# Patient Record
Sex: Male | Born: 1985 | Hispanic: No | Marital: Married | Smoking: Never smoker
Health system: Southern US, Community
[De-identification: ages and names within clinical notes are randomized; demographics above are authoritative.]

---

## 2014-08-14 ENCOUNTER — Encounter (HOSPITAL_COMMUNITY): Payer: Self-pay | Admitting: Emergency Medicine

## 2014-08-14 ENCOUNTER — Emergency Department (INDEPENDENT_AMBULATORY_CARE_PROVIDER_SITE_OTHER)
Admission: EM | Admit: 2014-08-14 | Discharge: 2014-08-14 | Disposition: A | Discharge status: Home / Self Care | Payer: Self-pay | Source: Home / Self Care | Attending: Family Medicine | Admitting: Family Medicine

## 2014-08-14 ENCOUNTER — Emergency Department (INDEPENDENT_AMBULATORY_CARE_PROVIDER_SITE_OTHER): Payer: Self-pay

## 2014-08-14 DIAGNOSIS — S91331A Puncture wound without foreign body, right foot, initial encounter: Secondary | ICD-10-CM

## 2014-08-14 DIAGNOSIS — M795 Residual foreign body in soft tissue: Secondary | ICD-10-CM

## 2014-08-14 DIAGNOSIS — Z23 Encounter for immunization: Secondary | ICD-10-CM

## 2014-08-14 MED ORDER — CIPROFLOXACIN HCL 500 MG PO TABS: 500.0000 mg | ORAL_TABLET | Freq: Two times a day (BID) | ORAL | Status: AC

## 2014-08-14 MED ORDER — SULFAMETHOXAZOLE-TRIMETHOPRIM 800-160 MG PO TABS: 2.0000 | ORAL_TABLET | Freq: Two times a day (BID) | ORAL | Status: AC

## 2014-08-14 MED ORDER — TETANUS-DIPHTH-ACELL PERTUSSIS 5-2.5-18.5 LF-MCG/0.5 IM SUSP
INTRAMUSCULAR | Status: AC
  Filled 2014-08-14: qty 0.5

## 2014-08-14 MED ORDER — TETANUS-DIPHTH-ACELL PERTUSSIS 5-2.5-18.5 LF-MCG/0.5 IM SUSP
0.5000 mL | Freq: Once | INTRAMUSCULAR | Status: AC
  Administered 2014-08-14: 0.5 mL via INTRAMUSCULAR

## 2014-08-14 NOTE — ED Notes (Signed)
Reports stepping on a rusty nail 2 days ago.  Having pain at bottom of right foot.  Unsure of last T-dap

## 2014-08-14 NOTE — ED Provider Notes (Signed)
Randy Holden is a 28 y.o. male who presents to Urgent Care today for puncture wound to the plantar right foot.  Patient stepped on a nail through his shoe 2 days ago. He notes pain and swelling on the plantar aspect of his foot. He is unsure of his last tetanus vaccine. No fevers or chills vomiting or diarrhea. Pain is moderate and worse with activity.   History reviewed. No pertinent past medical history. History reviewed. No pertinent past surgical history. History  Substance Use Topics  . Smoking status: Never Smoker   . Smokeless tobacco: Not on file  . Alcohol Use: Yes   ROS as above Medications: No current facility-administered medications for this encounter.   Current Outpatient Prescriptions  Medication Sig Dispense Refill  . ciprofloxacin (CIPRO) 500 MG tablet Take 1 tablet (500 mg total) by mouth 2 (two) times daily. 20 tablet 0  . sulfamethoxazole-trimethoprim (SEPTRA DS) 800-160 MG per tablet Take 2 tablets by mouth 2 (two) times daily. 28 tablet 0   No Known Allergies   Exam:  BP 115/75 mmHg  Pulse 66  Temp(Src) 97.9 F (36.6 C) (Oral)  Resp 16  SpO2 100% Gen: Well NAD Right foot: Small nonbleeding pressure wound with surrounding erythema on the plantar aspect of the right foot at the mid fourth and fifth metatarsal area. No fluctuance or induration present. Motion is intact distally. Pulses and sensation are intact.   Patient was given a Tdap Vaccine prior to discharge.  No results found for this or any previous visit (from the past 24 hour(s)). Dg Foot 2 Views Right  08/14/2014   CLINICAL DATA:  Right foot pain, possible foreign body  EXAM: RIGHT FOOT - 2 VIEW  COMPARISON:  None.  FINDINGS: Two views of the right foot submitted. No acute fracture or subluxation. No radiopaque foreign body is identified.  IMPRESSION: No acute fracture or subluxation. No radiopaque foreign body is identified.   Electronically Signed   By: Natasha MeadLiviu  Pop M.D.   On: 08/14/2014 09:10     Assessment and Plan: 28 y.o. male with puncture wound to the plantar aspect of the right foot. The wound occurred through a shoe bottom. The wound appears to be slightly infected. Plan to treat with Bactrim and Cipro to cover MRSA and pseudomonas. Additionally tetanus vaccine updated.  Discussed warning signs or symptoms. Please see discharge instructions. Patient expresses understanding.     Rodolph BongEvan S Lysbeth Dicola, MD 08/14/14 [phone]

## 2014-08-14 NOTE — Discharge Instructions (Signed)
Thank you for coming in today. Take both Cipro and Bactrim twice daily.  Come back if not improving. Go to the emergency room if you get worse  Puncture Wound A puncture wound is an injury that extends through all layers of the skin and into the tissue beneath the skin (subcutaneous tissue). Puncture wounds become infected easily because germs often enter the body and go beneath the skin during the injury. Having a deep wound with a small entrance point makes it difficult for your caregiver to adequately clean the wound. This is especially true if you have stepped on a nail and it has passed through a dirty shoe or other situations where the wound is obviously contaminated. CAUSES  Many puncture wounds involve glass, nails, splinters, fish hooks, or other objects that enter the skin (foreign bodies). A puncture wound may also be caused by a human bite or animal bite. DIAGNOSIS  A puncture wound is usually diagnosed by your history and a physical exam. You may need to have an X-ray or an ultrasound to check for any foreign bodies still in the wound. TREATMENT   Your caregiver will clean the wound as thoroughly as possible. Depending on the location of the wound, a bandage (dressing) may be applied.  Your caregiver might prescribe antibiotic medicines.  You may need a follow-up visit to check on your wound. Follow all instructions as directed by your caregiver. HOME CARE INSTRUCTIONS   Change your dressing once per day, or as directed by your caregiver. If the dressing sticks, it may be removed by soaking the area in water.  If your caregiver has given you follow-up instructions, it is very important that you return for a follow-up appointment. Not following up as directed could result in a chronic or permanent injury, pain, and disability.  Only take over-the-counter or prescription medicines for pain, discomfort, or fever as directed by your caregiver.  If you are given antibiotics, take  them as directed. Finish them even if you start to feel better. You may need a tetanus shot if:  You cannot remember when you had your last tetanus shot.  You have never had a tetanus shot. If you got a tetanus shot, your arm may swell, get red, and feel warm to the touch. This is common and not a problem. If you need a tetanus shot and you choose not to have one, there is a rare chance of getting tetanus. Sickness from tetanus can be serious. You may need a rabies shot if an animal bite caused your puncture wound. SEEK MEDICAL CARE IF:   You have redness, swelling, or increasing pain in the wound.  You have red streaks going away from the wound.  You notice a bad smell coming from the wound or dressing.  You have yellowish-white fluid (pus) coming from the wound.  You are treated with an antibiotic for infection, but the infection is not getting better.  You notice something in the wound, such as rubber from your shoe, cloth, or another object.  You have a fever.  You have severe pain.  You have difficulty breathing.  You feel dizzy or faint.  You cannot stop vomiting.  You lose feeling, develop numbness, or cannot move a limb below the wound.  Your symptoms worsen. MAKE SURE YOU:  Understand these instructions.  Will watch your condition.  Will get help right away if you are not doing well or get worse. Document Released: 06/10/2005 Document Revised: 11/23/2011 Document Reviewed: 02/17/2011  [employer]® Patient Information ©2015 [employer]. This information is not intended to replace advice given to you by your health care provider. Make sure you discuss any questions you have with your health care provider. ° °

## 2014-12-05 ENCOUNTER — Emergency Department (HOSPITAL_COMMUNITY): Payer: Self-pay

## 2014-12-05 ENCOUNTER — Encounter (HOSPITAL_COMMUNITY): Payer: Self-pay | Admitting: Emergency Medicine

## 2014-12-05 ENCOUNTER — Emergency Department (HOSPITAL_COMMUNITY)
Admission: EM | Admit: 2014-12-05 | Discharge: 2014-12-05 | Disposition: A | Discharge status: Home / Self Care | Payer: Self-pay | Attending: Emergency Medicine | Admitting: Emergency Medicine

## 2014-12-05 DIAGNOSIS — R112 Nausea with vomiting, unspecified: Secondary | ICD-10-CM | POA: Insufficient documentation

## 2014-12-05 DIAGNOSIS — R5383 Other fatigue: Secondary | ICD-10-CM | POA: Insufficient documentation

## 2014-12-05 DIAGNOSIS — R61 Generalized hyperhidrosis: Secondary | ICD-10-CM | POA: Insufficient documentation

## 2014-12-05 DIAGNOSIS — J3489 Other specified disorders of nose and nasal sinuses: Secondary | ICD-10-CM | POA: Insufficient documentation

## 2014-12-05 DIAGNOSIS — J029 Acute pharyngitis, unspecified: Secondary | ICD-10-CM | POA: Insufficient documentation

## 2014-12-05 DIAGNOSIS — M791 Myalgia: Secondary | ICD-10-CM | POA: Insufficient documentation

## 2014-12-05 DIAGNOSIS — R509 Fever, unspecified: Secondary | ICD-10-CM | POA: Insufficient documentation

## 2014-12-05 DIAGNOSIS — R0981 Nasal congestion: Secondary | ICD-10-CM | POA: Insufficient documentation

## 2014-12-05 DIAGNOSIS — R6889 Other general symptoms and signs: Secondary | ICD-10-CM

## 2014-12-05 DIAGNOSIS — R059 Cough, unspecified: Secondary | ICD-10-CM

## 2014-12-05 DIAGNOSIS — R05 Cough: Secondary | ICD-10-CM | POA: Insufficient documentation

## 2014-12-05 MED ORDER — BENZONATATE 100 MG PO CAPS: 100.0000 mg | ORAL_CAPSULE | Freq: Three times a day (TID) | ORAL | Status: AC

## 2014-12-05 MED ORDER — GUAIFENESIN 100 MG/5ML PO LIQD: 100.0000 mg | ORAL | Status: AC | PRN

## 2014-12-05 NOTE — ED Notes (Signed)
Pt st's he started having cold symptoms 2 1/2 weeks ago.  [facility] 1[facility]2 [facility].  Pt was given Z-Pack but continues to have productive cough

## 2014-12-05 NOTE — ED Notes (Signed)
Declined W/C at D/C and was escorted to lobby by RN. 

## 2014-12-05 NOTE — ED Provider Notes (Signed)
CSN: [id number]     Arrival date & time 12/05/14  2102 History  This chart was scribed for Fayrene Helper, PA-C working with Arby Barrette, MD by Elveria Rising, ED Scribe. This patient was seen in room TR06C/TR06C and the patient's care was started at 10:30 PM.   Chief Complaint  Patient presents with  . Cough   The history is provided by the patient. No language interpreter was used.   HPI Comments: Randy Holden is a 29 y.o. male who presents to the Emergency Department complaining of persistent, lingering cough. Patient shares flu like symptoms including subjective fever, chills, myalgias, fatigue, congestion, and rhinorrhea onset of 2.5 weeks ago. Patient was evaluated and diagnosed with a URI 1.5 weeks ago; patient discharged with Zithromax. Patient endorses taking antibiotics as directed. Patient reports that he feels better since completing the antibiotics, but reports continued productive cough, sore throat, decreased appetite, rhinorrhea, and nose bleeding from frequency of blowing his nose. Patient reports worse cough at night with lying down. Patient reports treatment with OTC cough syrup, without relief. Patient reports recent sick contacts at work. Patient is not a chronic smoker; admitting an occasional cigar.  History reviewed. No pertinent past medical history. History reviewed. No pertinent past surgical history. No family history on file. History  Substance Use Topics  . Smoking status: Never Smoker   . Smokeless tobacco: Not on file  . Alcohol Use: Yes    Review of Systems  Constitutional: Positive for chills and diaphoresis. Negative for fever.  HENT: Positive for congestion, rhinorrhea and sore throat. Negative for sneezing.   Respiratory: Positive for cough.   Gastrointestinal: Positive for nausea and vomiting.   Allergies  Review of patient's allergies indicates no known allergies.  Home Medications   Prior to Admission medications   Medication Sig Start Date  End Date Taking? Authorizing Provider  ibuprofen (ADVIL,MOTRIN) 200 MG tablet Take 200 mg by mouth every 6 (six) hours as needed.   Yes Historical Provider, MD  ciprofloxacin (CIPRO) 500 MG tablet Take 1 tablet (500 mg total) by mouth 2 (two) times daily. Patient not taking: Reported on 12/05/2014 08/14/14   Rodolph Bong, MD  sulfamethoxazole-trimethoprim (SEPTRA DS) 800-160 MG per tablet Take 2 tablets by mouth 2 (two) times daily. Patient not taking: Reported on 12/05/2014 08/14/14   Rodolph Bong, MD   Triage Vitals: BP 141/78 mmHg  Pulse 125  Temp(Src) 98.5 F (36.9 C) (Oral)  Resp 24  SpO2 96% Physical Exam  Constitutional: He is oriented to person, place, and time. He appears well-developed and well-nourished. No distress.  HENT:  Head: Normocephalic and atraumatic.  Right Ear: Tympanic membrane normal.  Mouth/Throat: Uvula is midline.  Small amount of dried blood in nares bilaterally. No active bleeding. No tonsillar enlargement.   Eyes: EOM are normal.  Neck: Neck supple. No tracheal deviation present.  Cardiovascular: Normal rate, regular rhythm and normal heart sounds.   Pulmonary/Chest: Effort normal and breath sounds normal. No respiratory distress. He has no wheezes.  Abdominal: Soft. There is no tenderness.  Musculoskeletal: Normal range of motion.  Neurological: He is alert and oriented to person, place, and time.  Skin: Skin is warm. He is diaphoretic.  Psychiatric: He has a normal mood and affect. His behavior is normal.  Nursing note and vitals reviewed.   ED Course  Procedures (including critical care time)  COORDINATION OF CARE: 10:40 PM- pt with flu like sxs.  cxr neg for pna.  Plans to prescribe  cough syrup. Discussed treatment plan with patient at bedside and patient agreed to plan.   Labs Review Labs Reviewed - No data to display  Imaging Review Dg Chest 2 View  12/05/2014   CLINICAL DATA:  Productive cough, chills  EXAM: CHEST  2 VIEW  COMPARISON:  None.   FINDINGS: Cardiomediastinal silhouette is unremarkable. No acute infiltrate or pleural effusion. No pulmonary edema. Bony thorax is unremarkable.  IMPRESSION: No active cardiopulmonary disease.   Electronically Signed   By: Natasha MeadLiviu  Pop M.D.   On: 12/05/2014 22:26     EKG Interpretation None      MDM   Final diagnoses:  Cough  Flu-like symptoms    BP 126/89 mmHg  Pulse 100  Temp(Src) 98.4 F (36.9 C) (Oral)  Resp 18  SpO2 100%   I have reviewed nursing notes and vital signs. I personally reviewed the imaging tests through PACS system  I reviewed available ER/hospitalization records thought the EMR   I personally performed the services described in this documentation, which was scribed in my presence. The recorded information has been reviewed and is accurate.     Fayrene HelperBowie Darothy Courtright, PA-C 12/06/14 0151  Arby BarretteMarcy Pfeiffer, MD 12/06/14 [phone]

## 2014-12-05 NOTE — Discharge Instructions (Signed)

## 2016-03-05 IMAGING — CR DG CHEST 2V
2 series · 2 of 2 positions shown · non-contrast
Comparison: None.

CLINICAL DATA: Productive cough, chills

EXAM:
CHEST  2 VIEW

[w chest pa]
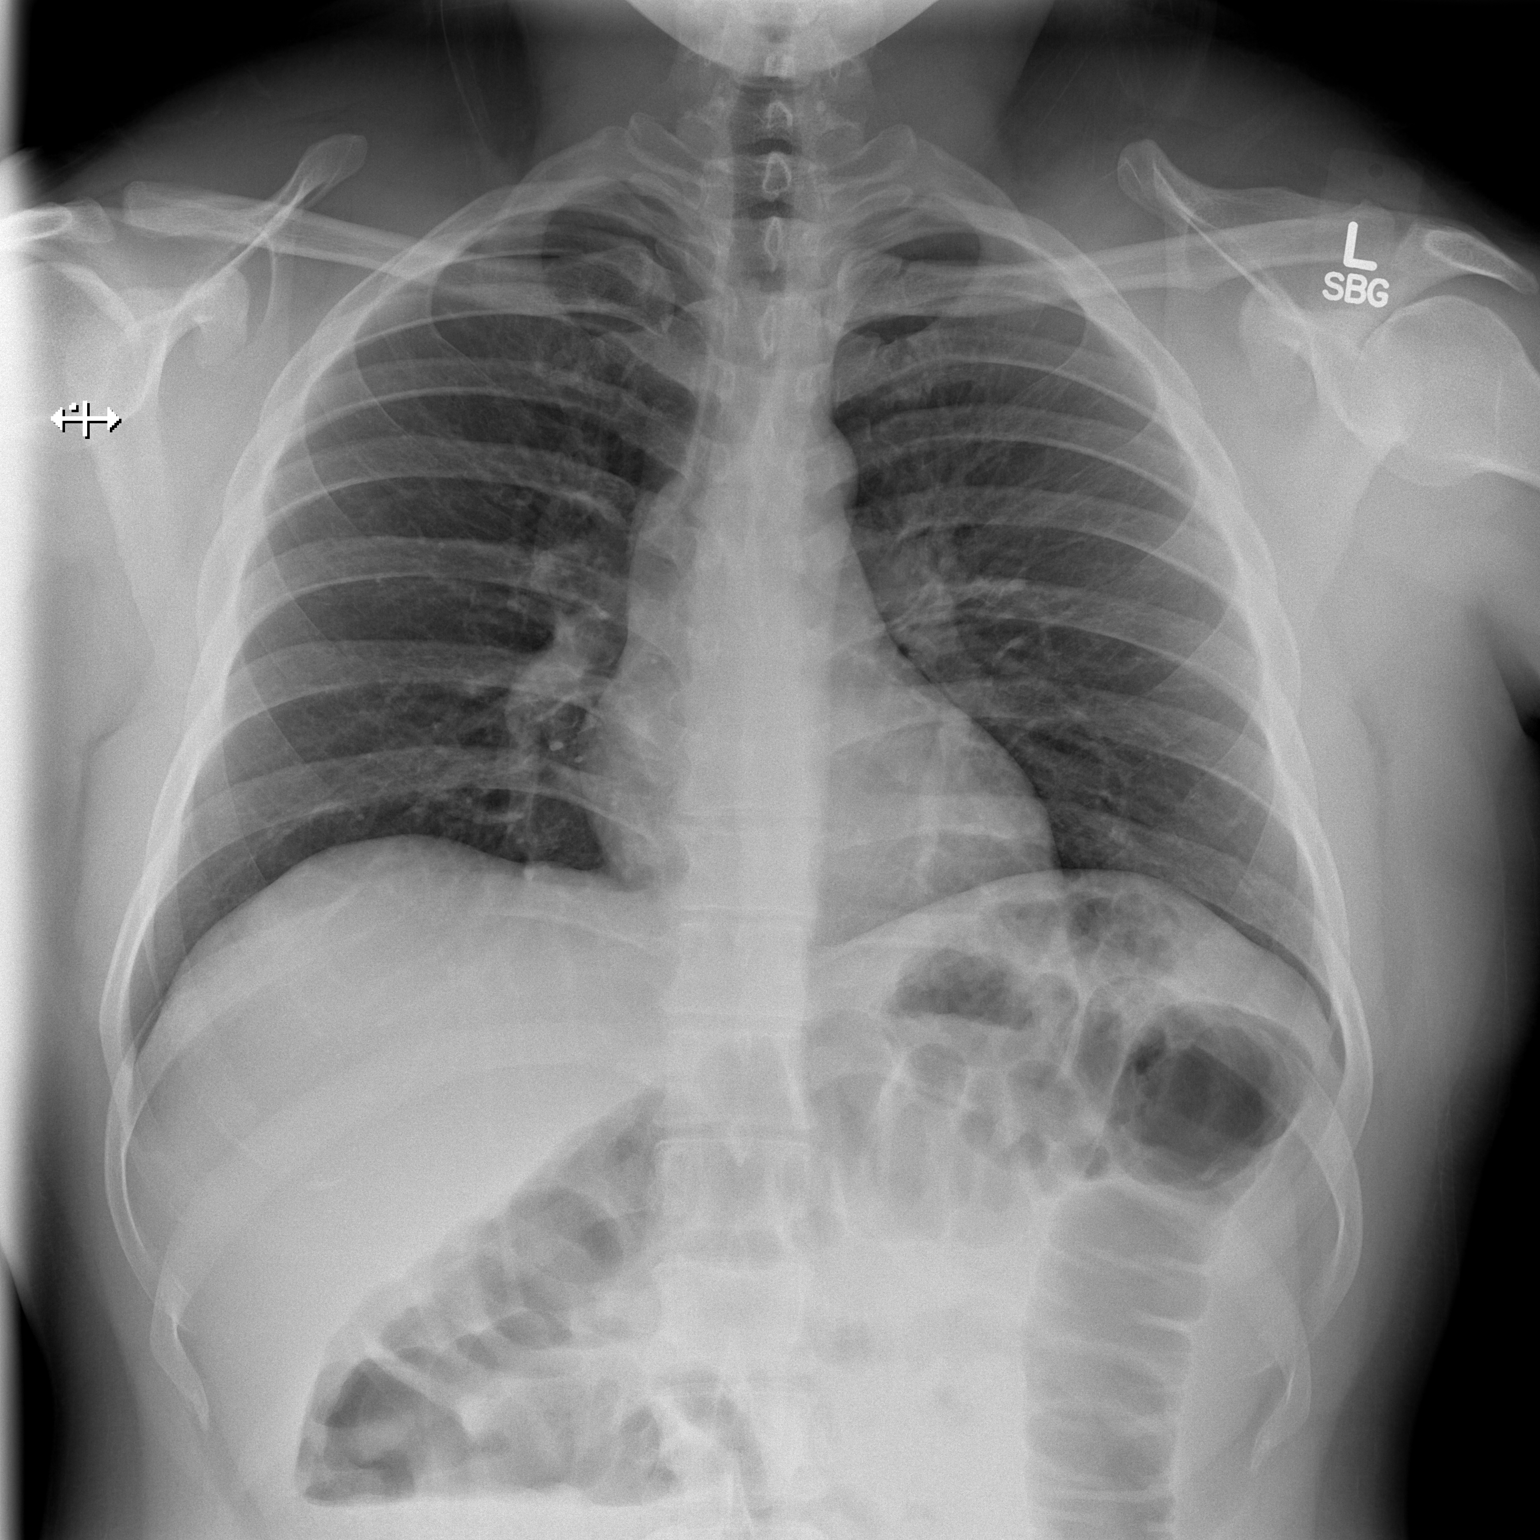

[w chest lat]
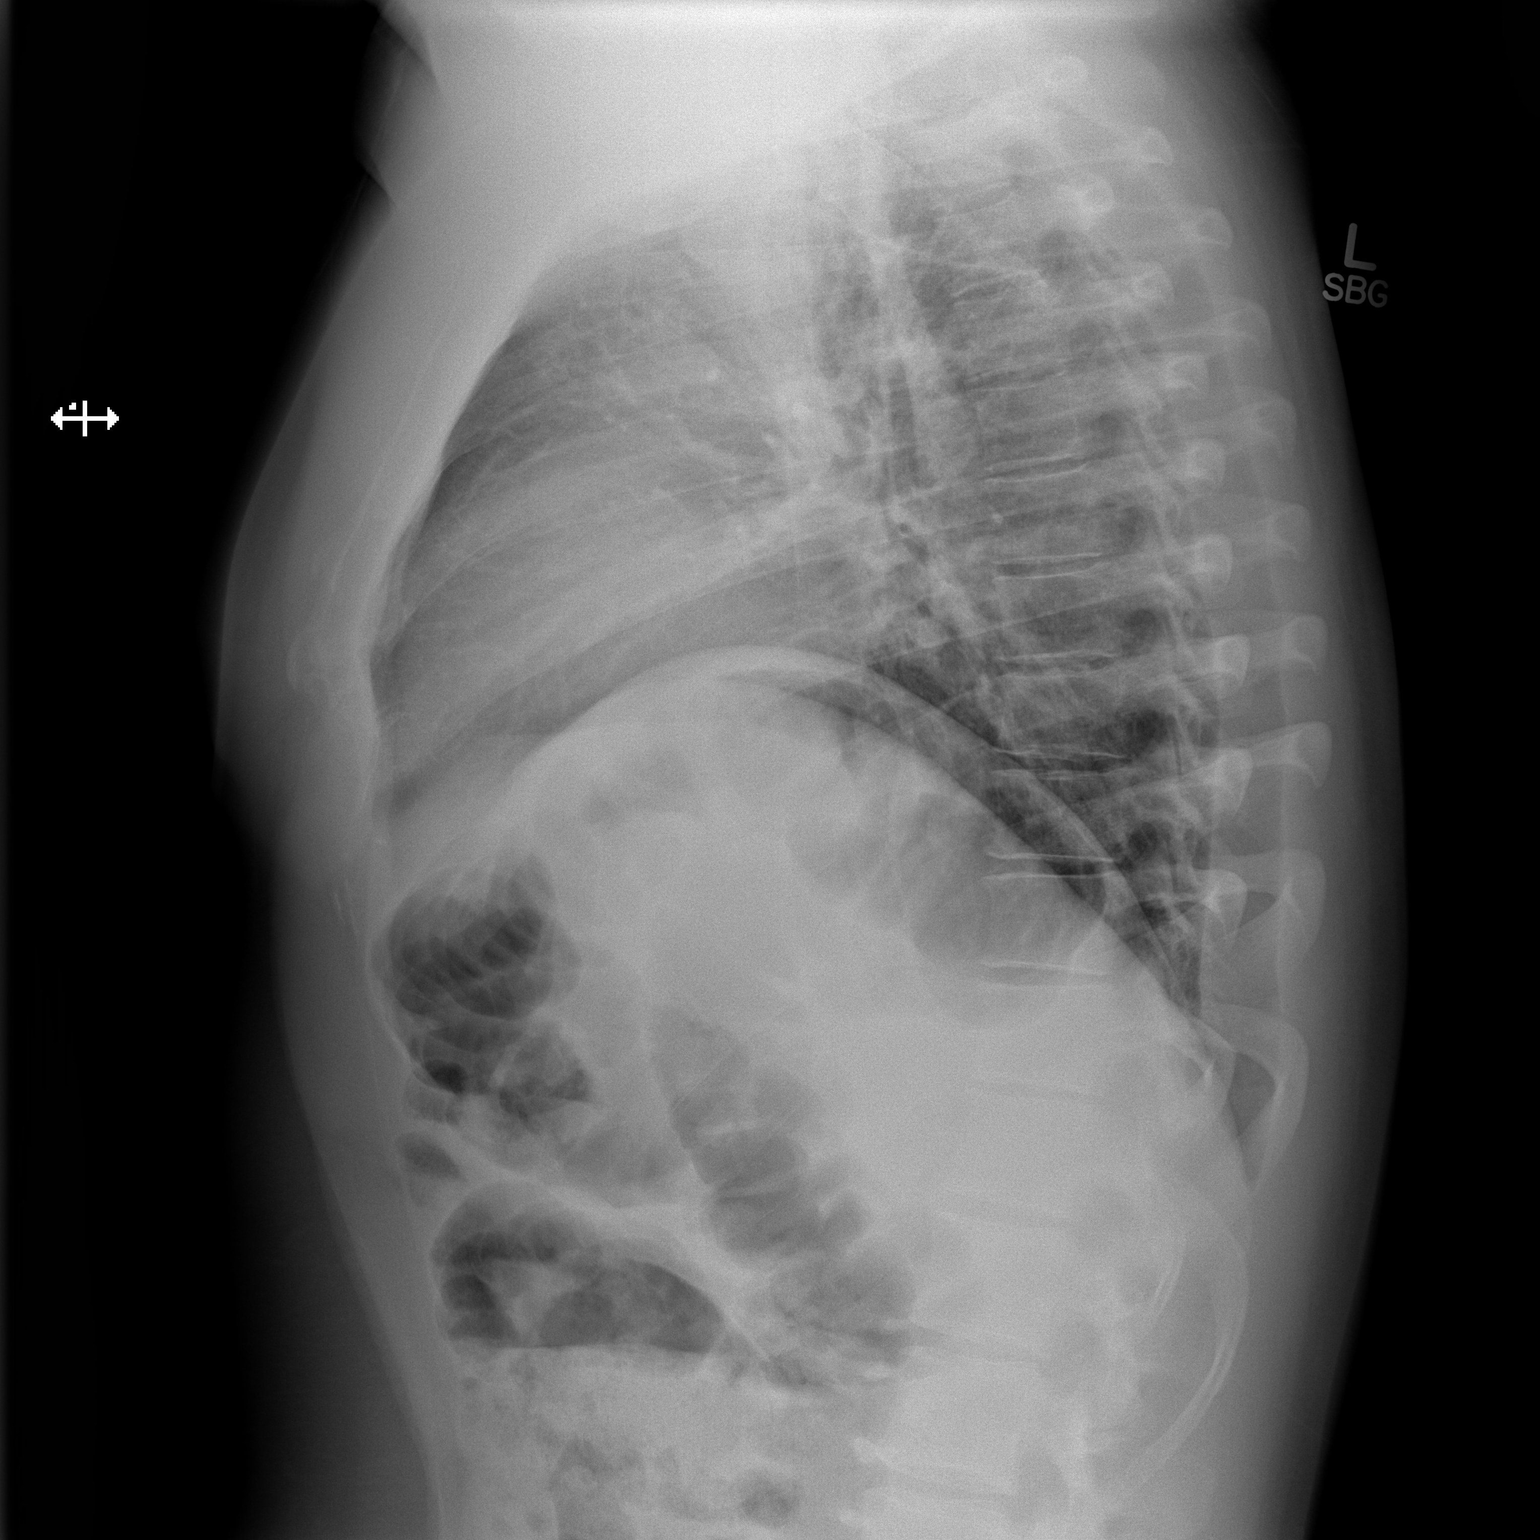

[2 of 2 positions shown; findings below may reference images not displayed]

FINDINGS: Cardiomediastinal silhouette is unremarkable. No acute infiltrate or
pleural effusion. No pulmonary edema. Bony thorax is unremarkable.
IMPRESSION: No active cardiopulmonary disease.
